# Patient Record
Sex: Female | Born: 2000 | Race: White | Hispanic: No | Smoking: Never smoker
Health system: Southern US, Community
[De-identification: ages and names within clinical notes are randomized; demographics above are authoritative.]

## PROBLEM LIST (undated history)

## (undated) ENCOUNTER — Inpatient Hospital Stay: Payer: Self-pay

## (undated) DIAGNOSIS — N390 Urinary tract infection, site not specified: Secondary | ICD-10-CM

## (undated) DIAGNOSIS — Z789 Other specified health status: Secondary | ICD-10-CM

## (undated) HISTORY — PX: NO PAST SURGERIES: SHX2092

---
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
## 2018-02-26 IMAGING — US US OB COMP +14 WK
1 series · 14 of 28 positions shown · non-contrast
Comparison: none

CLINICAL DATA: Fetal anatomy.

EXAM:
OBSTETRICAL ULTRASOUND >14 WKS

[Series 1: us ob comp +14 wk · 0.25mm/px · 14 of 78 slices shown]
[im 3/78]
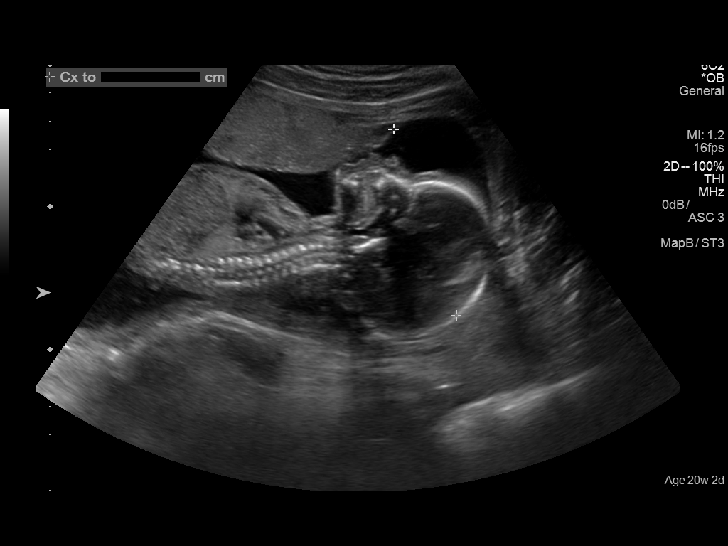
[im 9/78]
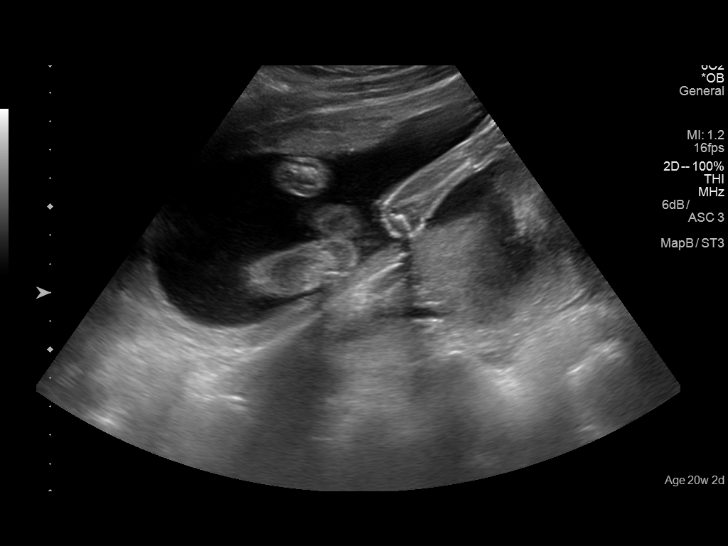
[im 15/78]
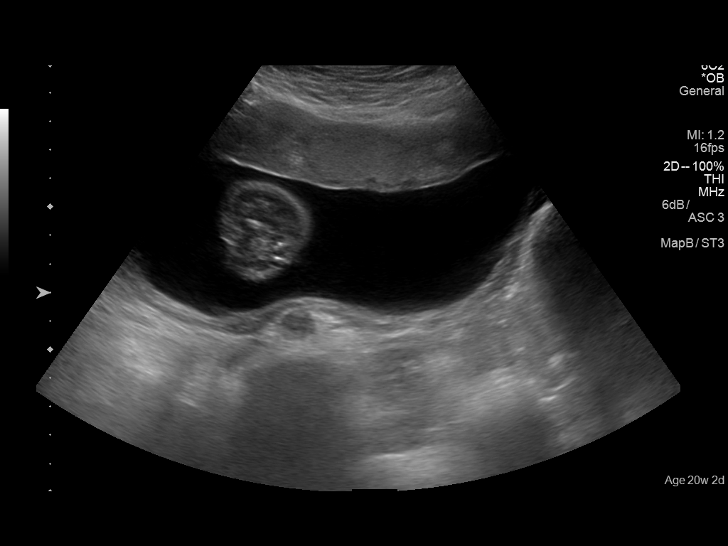
[im 20/78]
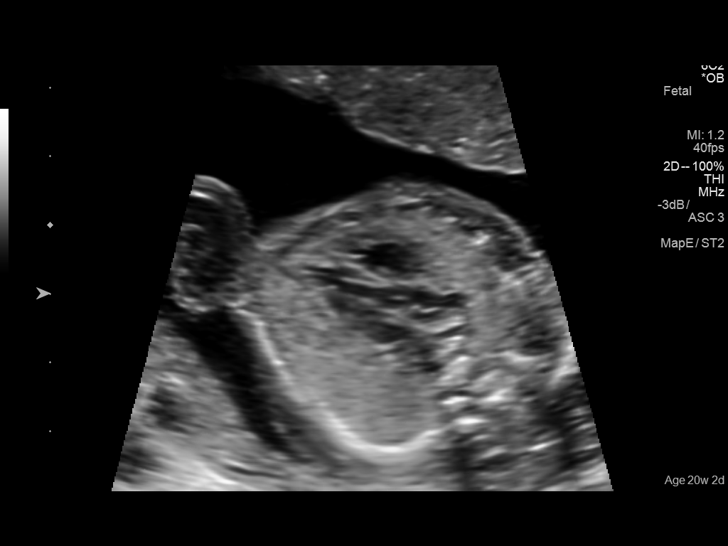
[im 26/78]
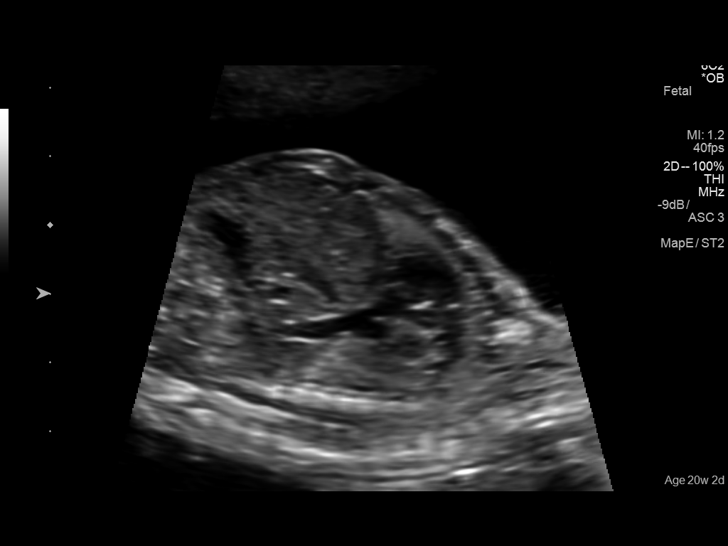
[im 32/78]
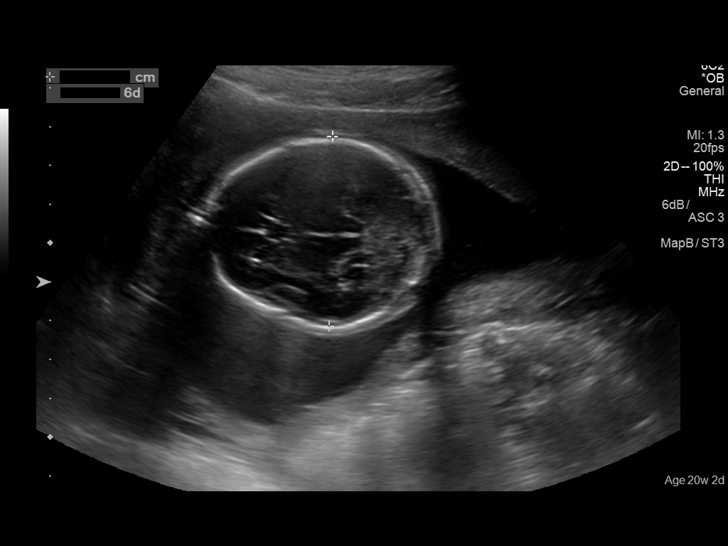
[im 38/78]
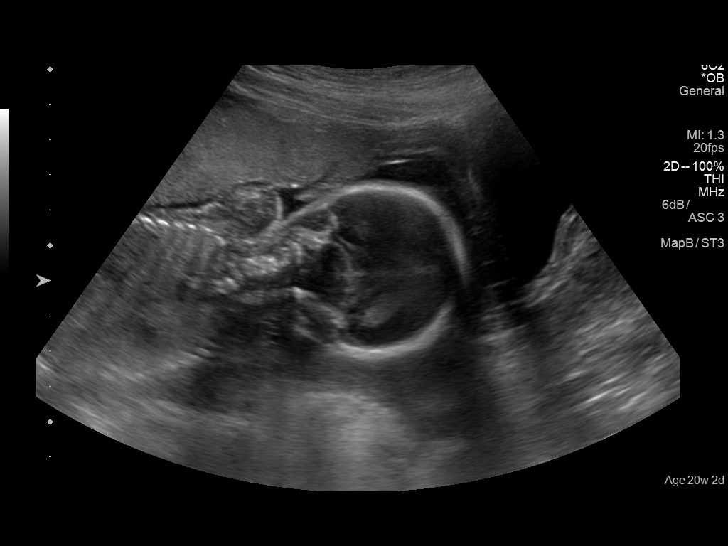
[im 43/78]
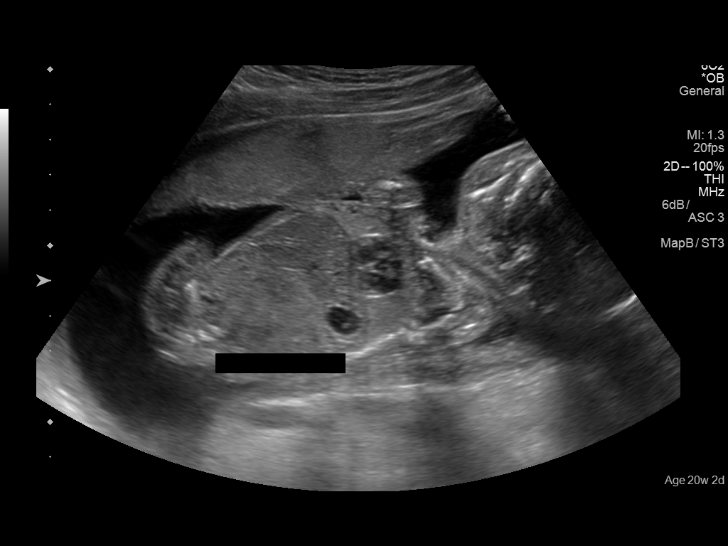
[im 49/78]
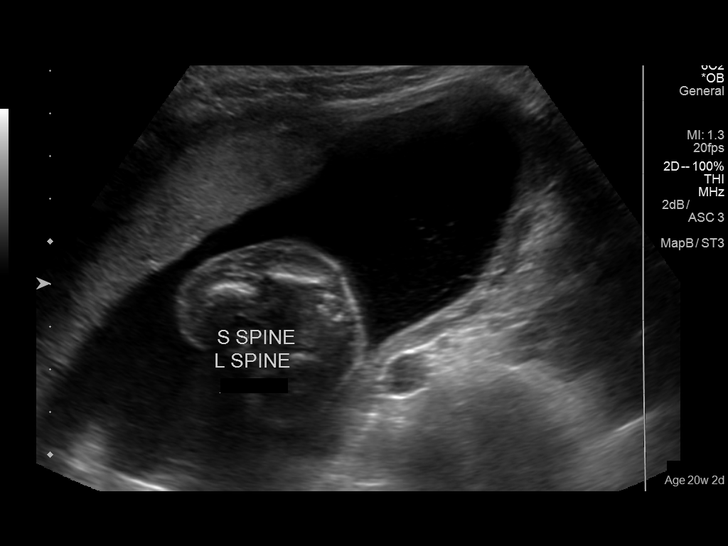
[im 55/78]
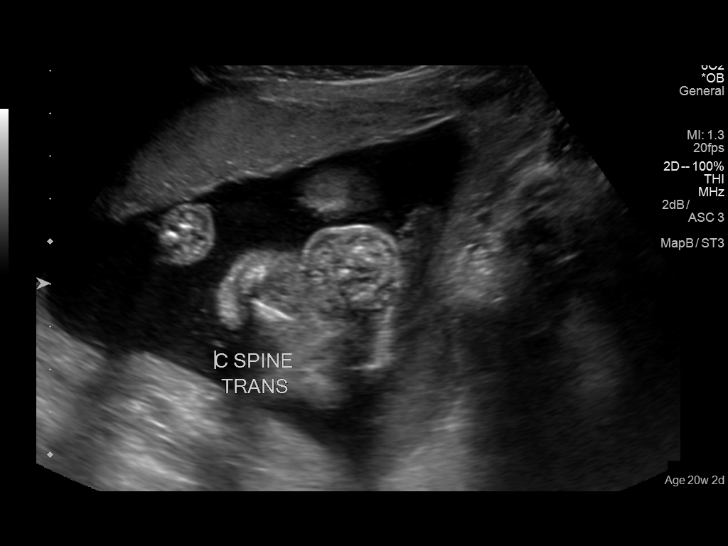
[im 60/78]
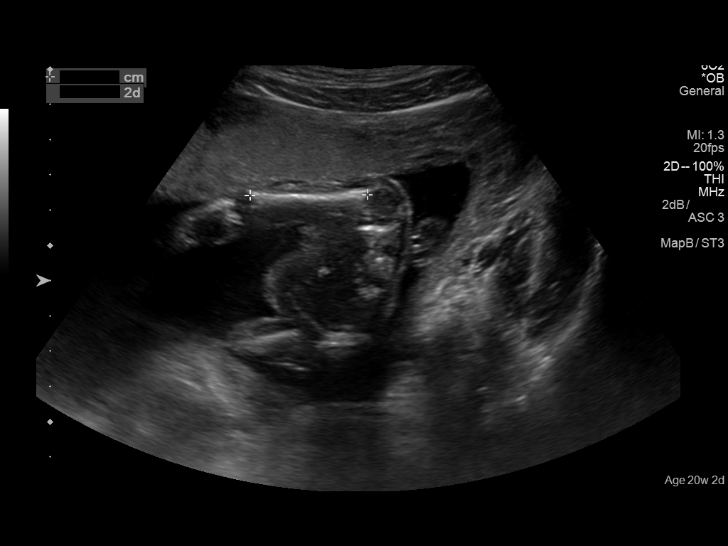
[im 66/78]
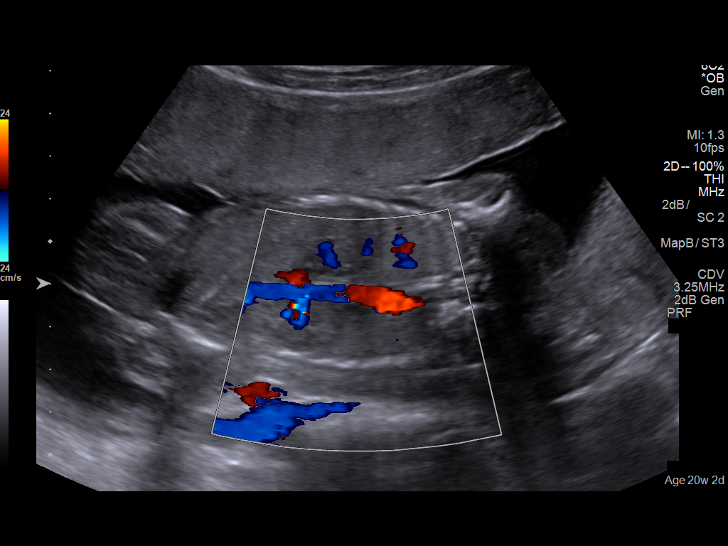
[im 72/78]
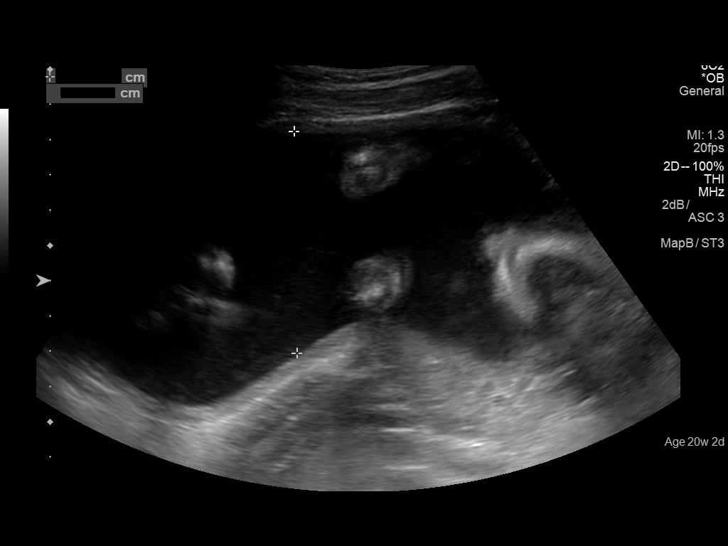
[im 78/78]
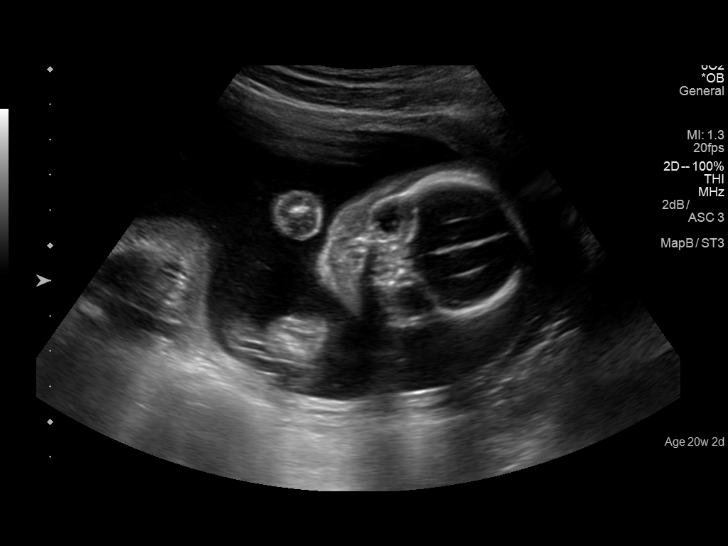

[14 of 28 positions shown; findings below may reference images not displayed]

FINDINGS: Number of Fetuses: 1

Heart Rate:  140 bpm

Movement: Yes

Presentation: Cephalic

Previa: No

Placental Location: Anterior

Amniotic Fluid (Subjective): Normal

Amniotic Fluid (Objective):

Vertical pocket 6.3cm

FETAL BIOMETRY

BPD:  4.9cm 20w 6d

HC:    17.7cm  20w   1d

AC:   15.3cm  20w   3d

FL:   3.6cm  21w   2d

Current Mean GA: 20w 6d              US EDC: 03/25/2017

Estimated Fetal Weight:  376g    73%ile

FETAL ANATOMY

Lateral Ventricles: Appears normal

Thalami/CSP: Appears normal

Posterior Fossa:  Appears normal

Nuchal Region: Appears normal

Upper Lip: Appears normal

Spine: Appears normal

4 Chamber Heart on Left: Appears normal

LVOT: Appears normal

RVOT: Appears normal

Stomach on Left: Appears normal

3 Vessel Cord: Appears normal

Cord Insertion site: Appears normal

Kidneys: Appears normal

Bladder: Appears normal

Extremities: Appears normal

Maternal Findings:

Cervix:  Normal cervical length measuring 3.7 cm.  Cervix is closed.
IMPRESSION: 1. Single live intrauterine pregnancy as detailed above.

## 2020-08-05 NOTE — L&D Delivery Note (Signed)
Delivery Note  Date of delivery: 05/20/2021 Estimated Date of Delivery: 05/13/21 No LMP recorded. EGA: [redacted]w[redacted]d  Delivery Note At 1:51 PM a viable female was delivered via Vaginal, Spontaneous (Presentation: Middle Occiput Anterior).  APGAR: 8, 9; weight 7 lb 3 oz (3260 g).   Placenta status: Spontaneous, Intact.  Cord: 3 vessels with the following complications: None.    First Stage: Labor onset: 0748 Augmentation : none -  Induction: Cytotec Analgesia /Anesthesia intrapartum: Epidural SROM at 0946  Wanda Booker presented to L&D with post date pregnancy. She was induced with cytotec. Epidural placed for pain relief.   Second Stage: Complete dilation at 1223 Onset of pushing at 1328 FHR second stage Cat I Delivery at 1351 on 05/20/2021  She progressed to complete and had a spontaneous vaginal birth of a live female over an intact perineum. The fetal head was delivered in OA position with restitution to ROA. No nuchal cord. Anterior then posterior shoulders delivered spontaneously. Baby placed on mom's abdomen and attended to by transition RN. Cord clamped and cut when pulseless by FOB.   Third Stage: Placenta delivered intact with 3VC at 1400 Placenta disposition: Routine disposal Uterine tone firm / bleeding min IV pitocin given for hemorrhage prophylaxis  Anesthesia: Epidural Episiotomy: None Lacerations: None Suture Repair: n/a Est. Blood Loss (mL): 200  Complications: None  Mom to postpartum.  Baby to Couplet care / Skin to Skin.  Newborn: Birth Weight: 7 lb 3 oz (3260 g)  Apgar Scores: 8, 9 Feeding planned: Formula   [name], CNM 05/20/2021 9:52 PM

## 2020-09-27 ENCOUNTER — Other Ambulatory Visit: Payer: Self-pay | Admitting: Primary Care

## 2020-09-27 DIAGNOSIS — Z3201 Encounter for pregnancy test, result positive: Secondary | ICD-10-CM

## 2020-10-17 ENCOUNTER — Ambulatory Visit
Admission: RE | Admit: 2020-10-17 | Discharge: 2020-10-17 | Disposition: A | Discharge status: Home / Self Care | Payer: BC Managed Care – PPO | Source: Ambulatory Visit | Attending: Primary Care | Admitting: Primary Care

## 2020-10-17 ENCOUNTER — Other Ambulatory Visit: Payer: Self-pay | Admitting: Primary Care

## 2020-10-17 ENCOUNTER — Other Ambulatory Visit: Payer: Self-pay

## 2020-10-17 DIAGNOSIS — Z3201 Encounter for pregnancy test, result positive: Secondary | ICD-10-CM | POA: Diagnosis present

## 2020-10-17 DIAGNOSIS — Z3687 Encounter for antenatal screening for uncertain dates: Secondary | ICD-10-CM | POA: Diagnosis not present

## 2020-12-07 ENCOUNTER — Other Ambulatory Visit: Payer: Self-pay | Admitting: Primary Care

## 2020-12-07 DIAGNOSIS — Z3480 Encounter for supervision of other normal pregnancy, unspecified trimester: Secondary | ICD-10-CM

## 2021-04-06 DIAGNOSIS — Z8759 Personal history of other complications of pregnancy, childbirth and the puerperium: Secondary | ICD-10-CM | POA: Insufficient documentation

## 2021-04-06 DIAGNOSIS — Z3A36 36 weeks gestation of pregnancy: Secondary | ICD-10-CM | POA: Insufficient documentation

## 2021-04-06 DIAGNOSIS — O0993 Supervision of high risk pregnancy, unspecified, third trimester: Secondary | ICD-10-CM | POA: Insufficient documentation

## 2021-04-06 DIAGNOSIS — O0933 Supervision of pregnancy with insufficient antenatal care, third trimester: Secondary | ICD-10-CM | POA: Insufficient documentation

## 2021-04-10 ENCOUNTER — Other Ambulatory Visit: Payer: Self-pay

## 2021-04-10 ENCOUNTER — Ambulatory Visit: Payer: BC Managed Care – PPO | Attending: Maternal & Fetal Medicine

## 2021-04-10 VITALS — BP 97/65 | HR 111 | Temp 98.1°F | Resp 20 | Wt 121.0 lb

## 2021-04-10 DIAGNOSIS — Z3480 Encounter for supervision of other normal pregnancy, unspecified trimester: Secondary | ICD-10-CM | POA: Diagnosis present

## 2021-04-10 DIAGNOSIS — Z3A00 Weeks of gestation of pregnancy not specified: Secondary | ICD-10-CM | POA: Insufficient documentation

## 2021-04-10 DIAGNOSIS — Z3689 Encounter for other specified antenatal screening: Secondary | ICD-10-CM | POA: Insufficient documentation

## 2021-04-10 DIAGNOSIS — Z3A35 35 weeks gestation of pregnancy: Secondary | ICD-10-CM | POA: Diagnosis not present

## 2021-04-10 DIAGNOSIS — Z8759 Personal history of other complications of pregnancy, childbirth and the puerperium: Secondary | ICD-10-CM

## 2021-04-10 DIAGNOSIS — O0993 Supervision of high risk pregnancy, unspecified, third trimester: Secondary | ICD-10-CM

## 2021-04-10 DIAGNOSIS — O0933 Supervision of pregnancy with insufficient antenatal care, third trimester: Secondary | ICD-10-CM

## 2021-04-10 DIAGNOSIS — Z3A36 36 weeks gestation of pregnancy: Secondary | ICD-10-CM

## 2021-04-11 ENCOUNTER — Other Ambulatory Visit: Payer: Self-pay | Admitting: Primary Care

## 2021-04-11 DIAGNOSIS — Z3689 Encounter for other specified antenatal screening: Secondary | ICD-10-CM

## 2021-04-11 DIAGNOSIS — Z3480 Encounter for supervision of other normal pregnancy, unspecified trimester: Secondary | ICD-10-CM

## 2021-05-20 ENCOUNTER — Inpatient Hospital Stay
Admission: EM | Admit: 2021-05-20 | Discharge: 2021-05-21 | DRG: 807 | Disposition: A | Discharge status: Home / Self Care | Payer: BC Managed Care – PPO | Attending: Obstetrics and Gynecology | Admitting: Obstetrics and Gynecology

## 2021-05-20 ENCOUNTER — Other Ambulatory Visit: Payer: Self-pay

## 2021-05-20 ENCOUNTER — Inpatient Hospital Stay: Payer: BC Managed Care – PPO | Admitting: Anesthesiology

## 2021-05-20 ENCOUNTER — Encounter: Payer: Self-pay | Admitting: Obstetrics and Gynecology

## 2021-05-20 DIAGNOSIS — Z23 Encounter for immunization: Secondary | ICD-10-CM | POA: Diagnosis not present

## 2021-05-20 DIAGNOSIS — Z3A41 41 weeks gestation of pregnancy: Secondary | ICD-10-CM | POA: Diagnosis not present

## 2021-05-20 DIAGNOSIS — O9902 Anemia complicating childbirth: Secondary | ICD-10-CM | POA: Diagnosis present

## 2021-05-20 DIAGNOSIS — Z20822 Contact with and (suspected) exposure to covid-19: Secondary | ICD-10-CM | POA: Diagnosis present

## 2021-05-20 DIAGNOSIS — O480 Post-term pregnancy: Principal | ICD-10-CM | POA: Diagnosis present

## 2021-05-20 DIAGNOSIS — D509 Iron deficiency anemia, unspecified: Secondary | ICD-10-CM | POA: Diagnosis present

## 2021-05-20 LAB — TYPE AND SCREEN
ABO/RH(D): A POS
Antibody Screen: NEGATIVE

## 2021-05-20 LAB — CBC
HCT: 33.3 % — ABNORMAL LOW (ref 36.0–46.0)
Hemoglobin: 11.4 g/dL — ABNORMAL LOW (ref 12.0–15.0)
MCH: 30.8 pg (ref 26.0–34.0)
MCHC: 34.2 g/dL (ref 30.0–36.0)
MCV: 90 fL (ref 80.0–100.0)
Platelets: 259 10*3/uL (ref 150–400)
RBC: 3.7 MIL/uL — ABNORMAL LOW (ref 3.87–5.11)
RDW: 14.3 % (ref 11.5–15.5)
WBC: 11.3 10*3/uL — ABNORMAL HIGH (ref 4.0–10.5)
nRBC: 0 % (ref 0.0–0.2)

## 2021-05-20 LAB — URINE DRUG SCREEN, QUALITATIVE (ARMC ONLY)
Amphetamines, Ur Screen: NOT DETECTED
Barbiturates, Ur Screen: NOT DETECTED
Benzodiazepine, Ur Scrn: NOT DETECTED
Cannabinoid 50 Ng, Ur ~~LOC~~: NOT DETECTED
Cocaine Metabolite,Ur ~~LOC~~: NOT DETECTED
MDMA (Ecstasy)Ur Screen: NOT DETECTED
Methadone Scn, Ur: NOT DETECTED
Opiate, Ur Screen: NOT DETECTED
Phencyclidine (PCP) Ur S: NOT DETECTED
Tricyclic, Ur Screen: NOT DETECTED

## 2021-05-20 LAB — RESP PANEL BY RT-PCR (FLU A&B, COVID) ARPGX2
Influenza A by PCR: NEGATIVE
Influenza B by PCR: NEGATIVE
SARS Coronavirus 2 by RT PCR: NEGATIVE

## 2021-05-20 MED ORDER — PHENYLEPHRINE 40 MCG/ML (10ML) SYRINGE FOR IV PUSH (FOR BLOOD PRESSURE SUPPORT): 80.0000 ug | PREFILLED_SYRINGE | INTRAVENOUS | Status: DC | PRN

## 2021-05-20 MED ORDER — SOD CITRATE-CITRIC ACID 500-334 MG/5ML PO SOLN: 30.0000 mL | ORAL | Status: DC | PRN

## 2021-05-20 MED ORDER — SIMETHICONE 80 MG PO CHEW: 80.0000 mg | CHEWABLE_TABLET | ORAL | Status: DC | PRN

## 2021-05-20 MED ORDER — LACTATED RINGERS IV SOLN: 500.0000 mL | INTRAVENOUS | Status: DC | PRN

## 2021-05-20 MED ORDER — MISOPROSTOL 200 MCG PO TABS
ORAL_TABLET | ORAL | Status: AC
  Filled 2021-05-20: qty 4

## 2021-05-20 MED ORDER — OXYCODONE HCL 5 MG PO TABS: 5.0000 mg | ORAL_TABLET | ORAL | Status: DC | PRN

## 2021-05-20 MED ORDER — TETANUS-DIPHTH-ACELL PERTUSSIS 5-2.5-18.5 LF-MCG/0.5 IM SUSY: 0.5000 mL | PREFILLED_SYRINGE | Freq: Once | INTRAMUSCULAR | Status: DC

## 2021-05-20 MED ORDER — DOCUSATE SODIUM 100 MG PO CAPS
100.0000 mg | ORAL_CAPSULE | Freq: Two times a day (BID) | ORAL | Status: DC
  Administered 2021-05-21: 100 mg via ORAL
  Filled 2021-05-20: qty 1

## 2021-05-20 MED ORDER — LIDOCAINE-EPINEPHRINE (PF) 1.5 %-1:200000 IJ SOLN
INTRAMUSCULAR | Status: DC | PRN
  Administered 2021-05-20: 3 mL via EPIDURAL

## 2021-05-20 MED ORDER — AMMONIA AROMATIC IN INHA
RESPIRATORY_TRACT | Status: AC
  Filled 2021-05-20: qty 10

## 2021-05-20 MED ORDER — ONDANSETRON HCL 4 MG/2ML IJ SOLN: 4.0000 mg | INTRAMUSCULAR | Status: DC | PRN

## 2021-05-20 MED ORDER — OXYCODONE HCL 5 MG PO TABS: 10.0000 mg | ORAL_TABLET | ORAL | Status: DC | PRN

## 2021-05-20 MED ORDER — DIBUCAINE (PERIANAL) 1 % EX OINT: 1.0000 "application " | TOPICAL_OINTMENT | CUTANEOUS | Status: DC | PRN

## 2021-05-20 MED ORDER — SODIUM CHLORIDE 0.9 % IV SOLN
INTRAVENOUS | Status: DC | PRN
  Administered 2021-05-20 (×2): 5 mL via EPIDURAL

## 2021-05-20 MED ORDER — OXYTOCIN-SODIUM CHLORIDE 30-0.9 UT/500ML-% IV SOLN
INTRAVENOUS | Status: AC
  Administered 2021-05-20: 333 mL via INTRAVENOUS
  Filled 2021-05-20: qty 500

## 2021-05-20 MED ORDER — ACETAMINOPHEN 325 MG PO TABS
650.0000 mg | ORAL_TABLET | ORAL | Status: DC | PRN
  Administered 2021-05-20 (×2): 650 mg via ORAL
  Filled 2021-05-20 (×2): qty 2

## 2021-05-20 MED ORDER — EPHEDRINE 5 MG/ML INJ: 10.0000 mg | INTRAVENOUS | Status: DC | PRN

## 2021-05-20 MED ORDER — DIPHENHYDRAMINE HCL 50 MG/ML IJ SOLN
12.5000 mg | INTRAMUSCULAR | Status: DC | PRN
Start: 2021-05-20 — End: 2021-05-20

## 2021-05-20 MED ORDER — IBUPROFEN 600 MG PO TABS
600.0000 mg | ORAL_TABLET | Freq: Four times a day (QID) | ORAL | Status: DC
  Administered 2021-05-20 – 2021-05-21 (×4): 600 mg via ORAL
  Filled 2021-05-20 (×4): qty 1

## 2021-05-20 MED ORDER — LIDOCAINE HCL (PF) 1 % IJ SOLN
30.0000 mL | INTRAMUSCULAR | Status: DC | PRN
Start: 2021-05-20 — End: 2021-05-20

## 2021-05-20 MED ORDER — ONDANSETRON HCL 4 MG/2ML IJ SOLN: 4.0000 mg | Freq: Four times a day (QID) | INTRAMUSCULAR | Status: DC | PRN

## 2021-05-20 MED ORDER — FENTANYL-BUPIVACAINE-NACL 0.5-0.125-0.9 MG/250ML-% EP SOLN
12.0000 mL/h | EPIDURAL | Status: DC | PRN
Start: 2021-05-20 — End: 2021-05-20
  Administered 2021-05-20: 12 mL/h via EPIDURAL

## 2021-05-20 MED ORDER — DIPHENHYDRAMINE HCL 25 MG PO CAPS: 25.0000 mg | ORAL_CAPSULE | Freq: Four times a day (QID) | ORAL | Status: DC | PRN

## 2021-05-20 MED ORDER — FENTANYL-BUPIVACAINE-NACL 0.5-0.125-0.9 MG/250ML-% EP SOLN
EPIDURAL | Status: AC
  Filled 2021-05-20: qty 250

## 2021-05-20 MED ORDER — BENZOCAINE-MENTHOL 20-0.5 % EX AERO: 1.0000 "application " | INHALATION_SPRAY | CUTANEOUS | Status: DC | PRN

## 2021-05-20 MED ORDER — LACTATED RINGERS IV SOLN
500.0000 mL | Freq: Once | INTRAVENOUS | Status: AC
  Administered 2021-05-20: 500 mL via INTRAVENOUS

## 2021-05-20 MED ORDER — COCONUT OIL OIL
1.0000 | TOPICAL_OIL | Status: DC | PRN
Start: 2021-05-20 — End: 2021-05-21

## 2021-05-20 MED ORDER — LIDOCAINE HCL (PF) 1 % IJ SOLN
INTRAMUSCULAR | Status: AC
  Filled 2021-05-20: qty 30

## 2021-05-20 MED ORDER — IBUPROFEN 600 MG PO TABS
ORAL_TABLET | ORAL | Status: AC
  Administered 2021-05-20: 600 mg via ORAL
  Filled 2021-05-20: qty 1

## 2021-05-20 MED ORDER — OXYCODONE-ACETAMINOPHEN 5-325 MG PO TABS: 2.0000 | ORAL_TABLET | ORAL | Status: DC | PRN

## 2021-05-20 MED ORDER — TERBUTALINE SULFATE 1 MG/ML IJ SOLN
0.2500 mg | Freq: Once | INTRAMUSCULAR | Status: DC | PRN
Start: 2021-05-20 — End: 2021-05-20

## 2021-05-20 MED ORDER — OXYCODONE-ACETAMINOPHEN 5-325 MG PO TABS: 1.0000 | ORAL_TABLET | ORAL | Status: DC | PRN

## 2021-05-20 MED ORDER — OXYTOCIN-SODIUM CHLORIDE 30-0.9 UT/500ML-% IV SOLN: 1.0000 m[IU]/min | INTRAVENOUS | Status: DC

## 2021-05-20 MED ORDER — OXYTOCIN 10 UNIT/ML IJ SOLN
INTRAMUSCULAR | Status: AC
  Filled 2021-05-20: qty 2

## 2021-05-20 MED ORDER — FERROUS SULFATE 325 (65 FE) MG PO TABS
325.0000 mg | ORAL_TABLET | Freq: Two times a day (BID) | ORAL | Status: DC
  Administered 2021-05-20 – 2021-05-21 (×2): 325 mg via ORAL
  Filled 2021-05-20 (×2): qty 1

## 2021-05-20 MED ORDER — OXYTOCIN-SODIUM CHLORIDE 30-0.9 UT/500ML-% IV SOLN
2.5000 [IU]/h | INTRAVENOUS | Status: DC
  Administered 2021-05-20: 2.5 [IU]/h via INTRAVENOUS

## 2021-05-20 MED ORDER — MISOPROSTOL 25 MCG QUARTER TABLET
25.0000 ug | ORAL_TABLET | ORAL | Status: DC | PRN
  Administered 2021-05-20: 25 ug via VAGINAL
  Filled 2021-05-20: qty 1

## 2021-05-20 MED ORDER — PRENATAL MULTIVITAMIN CH
1.0000 | ORAL_TABLET | Freq: Every day | ORAL | Status: DC
  Administered 2021-05-21: 1 via ORAL
  Filled 2021-05-20: qty 1

## 2021-05-20 MED ORDER — LIDOCAINE HCL (PF) 1 % IJ SOLN
INTRAMUSCULAR | Status: DC | PRN
  Administered 2021-05-20: 2 mL via SUBCUTANEOUS

## 2021-05-20 MED ORDER — LACTATED RINGERS IV SOLN: INTRAVENOUS | Status: DC

## 2021-05-20 MED ORDER — ACETAMINOPHEN 325 MG PO TABS: 650.0000 mg | ORAL_TABLET | ORAL | Status: DC | PRN

## 2021-05-20 MED ORDER — ONDANSETRON HCL 4 MG PO TABS
4.0000 mg | ORAL_TABLET | ORAL | Status: DC | PRN
  Filled 2021-05-20: qty 1

## 2021-05-20 MED ORDER — OXYTOCIN BOLUS FROM INFUSION
333.0000 mL | Freq: Once | INTRAVENOUS | Status: AC
Start: 2021-05-20 — End: 2021-05-20

## 2021-05-20 MED ORDER — WITCH HAZEL-GLYCERIN EX PADS: 1.0000 "application " | MEDICATED_PAD | CUTANEOUS | Status: DC | PRN

## 2021-05-20 NOTE — Progress Notes (Signed)
Labor Progress Note  Wanda Booker is a 20 y.o. [id number] at [redacted]w[redacted]d by ultrasound admitted for induction of labor due to Post dates. Due date 05/13/21.  Subjective: Pt is comfortable with epidural.  Objective: BP 106/62 (BP Location: Left Arm)   Pulse 87   Temp 98.3 F (36.8 C) (Oral)   Resp 16   Ht 5\' 2"  (1.575 m)   Wt 57.2 kg   SpO2 98%   Breastfeeding Unknown   BMI 23.05 kg/m    Fetal Assessment: FHT:  FHR: 125 bpm, variability: moderate,  accelerations:  Present,  decelerations:  Absent Category/reactivity:  Category I UC:   regular, every 1-3 minutes SVE:    Dilation: 10cm  Effacement: 100%  Station:  +1  Consistency: firm  Position: anterior  Membrane status:SROM at 0948 Amniotic color: MSAF  Labs: Lab Results  Component Value Date   WBC 11.3 (H) 05/20/2021   HGB 11.4 (L) 05/20/2021   HCT 33.3 (L) 05/20/2021   MCV 90.0 05/20/2021   PLT 259 05/20/2021    Assessment / Plan: Induction of labor due to postterm,  progressing on one dose of cytotec 0748 Cytotec 25 mcg placed PV 0948 SROM  Labor: Progressing normally Preeclampsia:   118/70 Fetal Wellbeing:  Category I Pain Control:  Epidural I/D:  n/a Anticipated MOD:  NSVD  05/22/2021, CNM 05/20/2021, 9:44 PM

## 2021-05-20 NOTE — Anesthesia Procedure Notes (Signed)
Epidural Patient location during procedure: OB Start time: 05/20/2021 10:32 AM End time: 05/20/2021 10:36 AM  Staffing Anesthesiologist: Lenard Simmer, MD Performed: anesthesiologist   Preanesthetic Checklist Completed: patient identified, IV checked, site marked, risks and benefits discussed, surgical consent, monitors and equipment checked, pre-op evaluation and timeout performed  Epidural Patient position: sitting Prep: ChloraPrep Patient monitoring: heart rate, continuous pulse ox and blood pressure Approach: midline Location: L3-L4 Injection technique: LOR saline  Needle:  Needle type: Tuohy  Needle gauge: 17 G Needle length: 9 cm and 9 Needle insertion depth: 3 cm Catheter type: closed end flexible Catheter size: 19 Gauge Catheter at skin depth: 8 cm Test dose: negative and 1.5% lidocaine with Epi 1:200 K  Assessment Sensory level: T10 Events: blood not aspirated, injection not painful, no injection resistance, no paresthesia and negative IV test  Additional Notes 1st attempt Pt. Evaluated and documentation done after procedure finished. Patient identified. Risks/Benefits/Options discussed with patient including but not limited to bleeding, infection, nerve damage, paralysis, failed block, incomplete pain control, headache, blood pressure changes, nausea, vomiting, reactions to medication both or allergic, itching and postpartum back pain. Confirmed with bedside nurse the patient's most recent platelet count. Confirmed with patient that they are not currently taking any anticoagulation, have any bleeding history or any family history of bleeding disorders. Patient expressed understanding and wished to proceed. All questions were answered. Sterile technique was used throughout the entire procedure. Please see nursing notes for vital signs. Test dose was given through epidural catheter and negative prior to continuing to dose epidural or start infusion. Warning signs of high  block given to the patient including shortness of breath, tingling/numbness in hands, complete motor block, or any concerning symptoms with instructions to call for help. Patient was given instructions on fall risk and not to get out of bed. All questions and concerns addressed with instructions to call with any issues or inadequate analgesia.    Patient tolerated the insertion well without immediate complications.Reason for block:procedure for pain

## 2021-05-20 NOTE — Anesthesia Preprocedure Evaluation (Signed)
Anesthesia Evaluation  Patient identified by MRN, date of birth, ID band Patient awake    Reviewed: Allergy & Precautions, H&P , NPO status , Patient's Chart, lab work & pertinent test results  History of Anesthesia Complications Negative for: history of anesthetic complications  Airway Mallampati: II  TM Distance: >3 FB Neck ROM: full    Dental  (+) Chipped   Pulmonary neg pulmonary ROS,    Pulmonary exam normal        Cardiovascular negative cardio ROS Normal cardiovascular exam     Neuro/Psych negative neurological ROS  negative psych ROS   GI/Hepatic Neg liver ROS, GERD  ,  Endo/Other  negative endocrine ROS  Renal/GU negative Renal ROS  negative genitourinary   Musculoskeletal   Abdominal   Peds  Hematology negative hematology ROS (+)   Anesthesia Other Findings Past Medical History: No date: Medical history non-contributory No date: UTI (urinary tract infection)   Reproductive/Obstetrics (+) Pregnancy                             Anesthesia Physical  Anesthesia Plan  ASA: II  Anesthesia Plan: Epidural   Post-op Pain Management:    Induction:   PONV Risk Score and Plan:   Airway Management Planned:   Additional Equipment:   Intra-op Plan:   Post-operative Plan:   Informed Consent: I have reviewed the patients History and Physical, chart, labs and discussed the procedure including the risks, benefits and alternatives for the proposed anesthesia with the patient or authorized representative who has indicated his/her understanding and acceptance.       Plan Discussed with: Anesthesiologist and CRNA  Anesthesia Plan Comments:         Anesthesia Quick Evaluation

## 2021-05-20 NOTE — H&P (Signed)
OB History & Physical   History of Present Illness:  Chief Complaint:   HPI:  RUTHANNE MCNEISH is a 20 y.o. G21P1001 female at [redacted]w[redacted]d dated by u/s.  She presents to L&D for IOL for post dates.  She reports:  -active fetal movement -no leakage of fluid  -no vaginal bleeding -no contractions  Pregnancy Issues: 1. Anemia 2. Varicella non-immune    Maternal Medical History:   Past Medical History:  Diagnosis Date   Medical history non-contributory    UTI (urinary tract infection)     Past Surgical History:  Procedure Laterality Date   NO PAST SURGERIES      No Known Allergies  Prior to Admission medications   Medication Sig Start Date End Date Taking? Authorizing Provider  dicyclomine (BENTYL) 20 MG tablet Take 1 tablet (20 mg total) by mouth 3 (three) times daily as needed (abdominal pain). Patient not taking: Reported on 04/10/2021 12/12/17   Phineas Semen, MD  ferrous sulfate 325 (65 FE) MG tablet Take 325 mg by mouth daily with breakfast.    [provider]  ibuprofen (ADVIL,MOTRIN) 600 MG tablet Take 1 tablet (600 mg total) by mouth every 6 (six) hours. Patient not taking: Reported on 04/10/2021 03/27/17   Christeen Douglas, MD  medroxyPROGESTERone (DEPO-PROVERA) 150 MG/ML injection Inject 1 mL (150 mg total) into the muscle every 3 (three) months. Patient not taking: Reported on 04/10/2021 03/27/17   Christeen Douglas, MD  ondansetron (ZOFRAN) 4 MG tablet Take 1 tablet (4 mg total) by mouth every 4 (four) hours as needed for nausea. Patient not taking: Reported on 04/10/2021 03/27/17   Christeen Douglas, MD  ondansetron [facility]) 4 MG tablet Take 1 tablet (4 mg total) by mouth every 8 (eight) hours as needed for nausea or vomiting. 12/12/17   Phineas Semen, MD  Prenatal Vit-Fe Fumarate-FA (PRENATAL MULTIVITAMIN) TABS tablet Take 1 tablet by mouth daily at 12 noon.    [provider]     [facility]ren[facility] care site: [facility]  Social History: She  reports that she  has never smoked. She has never used smokeless tobacco. She reports that she does not drink alcohol and does not use drugs.  Family History: family history is not on file.   Review of Systems: A full review of systems was performed and negative except as noted in the HPI.    Physical Exam:  Vital Signs: BP 121/84 (BP Location: Left Arm)   Pulse 97   Temp 98.4 F (36.9 C) (Oral)   Resp 16   Ht 5\' 2"  (1.575 m)   Wt 57.2 kg   BMI 23.05 kg/m   General:   alert and cooperative  Skin:  normal  Neurologic:    Alert & oriented x 3  Lungs:    Nl effort  Heart:   regular rate and rhythm  Abdomen:  soft, non-tender; bowel sounds normal; no masses,  no organomegaly  Extremities: : non-tender, symmetric, no edema bilaterally.       Pertinent Results:  Prenatal Labs: Blood type/Rh A pos  Antibody screen neg  Rubella Immune  Varicella Non-Immune  RPR NR  HBsAg Neg  HIV NR  GC neg  Chlamydia neg  Genetic screening   1 hour GTT 150  3 hour GTT 76 - 137 - 111 - 121  GBS neg   FHT: FHR: 135 bpm, variability: moderate,  accelerations:  Present,  decelerations:  Absent Category/reactivity:  Category I TOCO: none SVE: Dilation: 2.5 / Effacement (%):  30 / Station: -2     Assessment:  XIN KLAWITTER is a 20 y.o. G2P1001 female at [redacted]w[redacted]d with post date pregnancy.   Plan:  1. Admit to Labor & Delivery; consents reviewed and obtained  2. Fetal Well being  - Fetal Tracing: Cat I - GBS neg - Presentation: vtx confirmed by SVE   3. Routine OB: - Prenatal labs reviewed, as above - Rh pos - CBC & T&S on admit - Clear fluids, IV  4. Induction of Labor -  Contractions by external toco in place -  Pelvis proven to 5lb9oz -  Plan for induction with Cytotec -  Plan for continuous fetal monitoring  -  Maternal pain control as desired: IVPM, nitrous, regional anesthesia - Anticipate vaginal delivery  5. Post Partum Planning: - Infant feeding: Formula - Contraception: Depo - TDAP  04/11/21  - Flu needed  Kaarin Pardy, CNM 05/20/2021 7:16 AM

## 2021-05-20 NOTE — Discharge Summary (Signed)
Obstetrical Discharge Summary  Patient Name: Wanda Booker DOB: 2001-03-02 MRN: [id number]  Date of Admission: 05/20/2021 Date of Delivery: 05/20/21 Delivered by: Anselm Pancoast Date of Discharge: 05/21/2021  [facility]: [facility] LMP:No LMP recorded. EDC Estimated Date of Delivery: 05/13/21 Gestational Age at Delivery: [redacted]w[redacted]d   Antepartum complications:  1. Anemia 2. Varicella non-immune Admitting Diagnosis: IOL for post date pregnancy Secondary Diagnosis: Patient Active Problem List   Diagnosis Date Noted   NSVD (normal spontaneous vaginal delivery) 05/20/2021   History of prior pregnancy with small for gestational age newborn 04/06/2021    Augmentation: Cytotec Complications: None  Intrapartum complications/course:  Delivery Type: spontaneous vaginal delivery Anesthesia: epidural Placenta: spontaneous Laceration: none Episiotomy: none Newborn Data: Live born female  Birth Weight: 7 lb 3 oz (3260 g) APGAR: 8, 9  Newborn Delivery   Birth date/time: 05/20/2021 13:51:00 Delivery type: Vaginal, Spontaneous      20 y.o. [id number] at [redacted]w[redacted]d presenting with post date pregnancy, SROM with MSAF.  She progressed to complete and pushed over an intact perineum and delivered the fetal head, followed promptly by the shoulders. She was in control the whole time, and the baby placed on the maternal abdomen. Delayed cord clamping and the FOB cut the baby's cord, while the baby was skin to skin. The placenta delivered spontaneously and intact. No lacerations note. Mom and baby tolerated the procedure well.   Postpartum Procedures: none  Post partum course:  Patient had an uncomplicated postpartum course.  By time of discharge on PPD#1, her pain was controlled on oral pain medications; she had appropriate lochia and was ambulating, voiding without difficulty and tolerating regular diet.  She was deemed stable for discharge to home.    Discharge Physical Exam:  BP 113/69 (BP Location:  Left Arm)   Pulse 67   Temp 98 F (36.7 C) (Oral)   Resp 18   Ht 5\' 2"  (1.575 m)   Wt 57.2 kg   SpO2 98%   Breastfeeding Unknown   BMI 23.05 kg/m   General: alert and no distress Pulm: normal respiratory effort Lochia: appropriate Abdomen: soft, NT Uterine Fundus: firm, below umbilicus Extremities: No evidence of DVT seen on physical exam. No lower extremity edema. Edinburgh Postnatal Depression Scale Screening Tool 05/21/2021  I have been able to laugh and see the funny side of things. 0  I have looked forward with enjoyment to things. 0  I have blamed myself unnecessarily when things went wrong. 0  I have been anxious or worried for no good reason. 0  I have felt scared or panicky for no good reason. 0  Things have been getting on top of me. 1  I have been so unhappy that I have had difficulty sleeping. 0  I have felt sad or miserable. 0  I have been so unhappy that I have been crying. 0  The thought of harming myself has occurred to me. 0  Edinburgh Postnatal Depression Scale Total 1     Labs: CBC Latest Ref Rng & Units 05/21/2021 05/20/2021 12/12/2017  WBC 4.0 - 10.5 K/uL 11.4(H) 11.3(H) 4.6  Hemoglobin 12.0 - 15.0 g/dL 02/11/2018) 11.4(L) 14.5  Hematocrit 36.0 - 46.0 % 27.8(L) 33.3(L) 42.5  Platelets 150 - 400 K/uL 208 259 264   A POS Hemoglobin  Date Value Ref Range Status  05/21/2021 9.5 (L) 12.0 - 15.0 g/dL Final   HCT  Date Value Ref Range Status  05/21/2021 27.8 (L) 36.0 - 46.0 % Final  Disposition: stable, discharge to home Baby Feeding: formula Baby Disposition: home with mom  Contraception: Depo  Prenatal Labs:  Blood type/Rh A pos  Antibody screen neg  Rubella Immune  Varicella Non-Immune  RPR NR  HBsAg Neg  HIV NR  GC neg  Chlamydia neg  Genetic screening    1 hour GTT 150  3 hour GTT 76 - 137 - 111 - 121  GBS neg   Rh Immune globulin given: n/a Rubella vaccine given: Immune Varicella vaccine given: Needed PP Tdap  vaccine given in AP or PP setting: given 04/11/21 Flu vaccine given in AP or PP setting: Needed PP  Plan: Cloretta Ned was discharged to home in good condition. Follow-up appointment with delivering provider in 6 weeks.  Discharge Instructions: Per After Visit Summary. Activity: Advance as tolerated. Pelvic rest for 6 weeks.   Diet: Regular Discharge Medications: Allergies as of 05/21/2021   No Known Allergies      Medication List     STOP taking these medications    medroxyPROGESTERone 150 MG/ML injection Commonly known as: DEPO-PROVERA   ondansetron 4 MG tablet Commonly known as: Zofran       TAKE these medications    acetaminophen 325 MG tablet Commonly known as: Tylenol Take 2 tablets (650 mg total) by mouth every 4 (four) hours as needed (for pain scale < 4).   benzocaine-Menthol 20-0.5 % Aero Commonly known as: DERMOPLAST Apply 1 application topically as needed for irritation (perineal discomfort).   coconut oil Oil Apply 1 application topically as needed.   dibucaine 1 % Oint Commonly known as: NUPERCAINAL Place 1 application rectally as needed for hemorrhoids.   dicyclomine 20 MG tablet Commonly known as: Bentyl Take 1 tablet (20 mg total) by mouth 3 (three) times daily as needed (abdominal pain).   docusate sodium 100 MG capsule Commonly known as: COLACE Take 1 capsule (100 mg total) by mouth 2 (two) times daily.   ferrous sulfate 325 (65 FE) MG tablet Take 1 tablet (325 mg total) by mouth 2 (two) times daily with a meal. What changed: when to take this   ibuprofen 600 MG tablet Commonly known as: ADVIL Take 1 tablet (600 mg total) by mouth every 6 (six) hours.   prenatal multivitamin Tabs tablet Take 1 tablet by mouth daily at 12 noon.   witch hazel-glycerin pad Commonly known as: TUCKS Apply 1 application topically as needed for hemorrhoids.       Outpatient follow up:   Follow-up Information     [facility] Follow up in 6 week(s).   Specialty: General Practice Contact information: [address] [place] [address] [phone]         Haroldine Laws, CNM Follow up.   Specialty: Certified Nurse Midwife Contact information: [address] [place] [address] [phone]                 Signed: Chari Manning [facility]  05/21/2021 11:46 AM

## 2021-05-21 LAB — CBC
HCT: 27.8 % — ABNORMAL LOW (ref 36.0–46.0)
Hemoglobin: 9.5 g/dL — ABNORMAL LOW (ref 12.0–15.0)
MCH: 31.4 pg (ref 26.0–34.0)
MCHC: 34.2 g/dL (ref 30.0–36.0)
MCV: 91.7 fL (ref 80.0–100.0)
Platelets: 208 10*3/uL (ref 150–400)
RBC: 3.03 MIL/uL — ABNORMAL LOW (ref 3.87–5.11)
RDW: 14.2 % (ref 11.5–15.5)
WBC: 11.4 10*3/uL — ABNORMAL HIGH (ref 4.0–10.5)
nRBC: 0 % (ref 0.0–0.2)

## 2021-05-21 LAB — RPR: RPR Ser Ql: NONREACTIVE

## 2021-05-21 MED ORDER — BENZOCAINE-MENTHOL 20-0.5 % EX AERO: 1.0000 "application " | INHALATION_SPRAY | CUTANEOUS | Status: AC | PRN

## 2021-05-21 MED ORDER — MEDROXYPROGESTERONE ACETATE 150 MG/ML IM SUSP
150.0000 mg | Freq: Once | INTRAMUSCULAR | Status: AC
  Administered 2021-05-21: 150 mg via INTRAMUSCULAR
  Filled 2021-05-21: qty 1

## 2021-05-21 MED ORDER — VARICELLA VIRUS VACCINE LIVE 1350 PFU/0.5ML IJ SUSR
0.5000 mL | Freq: Once | INTRAMUSCULAR | Status: AC
  Administered 2021-05-21: 0.5 mL via SUBCUTANEOUS
  Filled 2021-05-21 (×3): qty 0.5

## 2021-05-21 MED ORDER — WITCH HAZEL-GLYCERIN EX PADS: 1.0000 "application " | MEDICATED_PAD | CUTANEOUS | 12 refills | Status: AC | PRN

## 2021-05-21 MED ORDER — INFLUENZA VAC SPLIT QUAD 0.5 ML IM SUSY
0.5000 mL | PREFILLED_SYRINGE | INTRAMUSCULAR | Status: DC | PRN
  Filled 2021-05-21: qty 0.5

## 2021-05-21 MED ORDER — DOCUSATE SODIUM 100 MG PO CAPS: 100.0000 mg | ORAL_CAPSULE | Freq: Two times a day (BID) | ORAL | 0 refills | Status: AC

## 2021-05-21 MED ORDER — IBUPROFEN 600 MG PO TABS: 600.0000 mg | ORAL_TABLET | Freq: Four times a day (QID) | ORAL | 0 refills | Status: AC

## 2021-05-21 MED ORDER — DIBUCAINE (PERIANAL) 1 % EX OINT: 1.0000 "application " | TOPICAL_OINTMENT | CUTANEOUS | Status: AC | PRN

## 2021-05-21 MED ORDER — COCONUT OIL OIL: 1.0000 "application " | TOPICAL_OIL | 0 refills | Status: AC | PRN

## 2021-05-21 MED ORDER — FERROUS SULFATE 325 (65 FE) MG PO TABS: 325.0000 mg | ORAL_TABLET | Freq: Two times a day (BID) | ORAL | 3 refills | Status: AC

## 2021-05-21 MED ORDER — ACETAMINOPHEN 325 MG PO TABS: 650.0000 mg | ORAL_TABLET | ORAL | Status: AC | PRN

## 2021-05-21 NOTE — Anesthesia Postprocedure Evaluation (Signed)
Anesthesia Post Note  Patient: Wanda Booker  Procedure(s) Performed: AN AD HOC LABOR EPIDURAL  Patient location during evaluation: Mother Baby Anesthesia Type: Epidural Level of consciousness: awake and alert Pain management: pain level controlled Vital Signs Assessment: post-procedure vital signs reviewed and stable Respiratory status: spontaneous breathing, nonlabored ventilation and respiratory function stable Cardiovascular status: stable Postop Assessment: no headache, no backache, epidural receding and able to ambulate Anesthetic complications: no   No notable events documented.   Last Vitals:  Vitals:   05/20/21 2320 05/21/21 0317  BP: 108/79 122/75  Pulse: 80 79  Resp: 18 18  Temp:  36.5 C  SpO2: 98% 100%    Last Pain:  Vitals:   05/21/21 0317  TempSrc: Oral  PainSc:                  [name]

## 2021-05-21 NOTE — Progress Notes (Signed)
Patient discharged home with family.  Discharge instructions, when to follow up, and prescriptions reviewed with patient.  Patient verbalized understanding. Patient will be escorted out by auxiliary.   

## 2021-05-21 NOTE — Lactation Note (Signed)
This note was copied from a baby's chart. Lactation Consultation Note  Patient Name: Wanda Booker [id number] Date: 05/21/2021 Reason for consult: Initial assessment;Mother's request Age:22 hours Lactation Rounds: LC to the room for a visit. Mother and baby are resting. Baby was cueing in bassinet with pacifier.  Mother states her plan is to feed breast and formula. LC reviewed and encouraged feeding on demand and with cues. If baby is not cueing we encourage hand expression and to wake baby. Encouraged breastfeed prior to bottle. Reviewed diaper counts for days of life and when to call Peds with questions. Reviewed "understanding Postpartum and Newborn care " booklet at bedside. Reviewed pacifier, pumping, and bottles are not encouraged until breastfeeding is established and going well in the first 4 weeks, but if Mother would like to pump because of more formula supplementation LC would encourage more stimulation. Mother stated with her first child she hated pumping because it was painful. LC reviewed she may need smaller flanges such as 21 or 19 mm, and try not to turn the pump suction up to where it is painful. Parents stated understanding with all teaching.   Maternal Data Has patient been taught Hand Expression?: Yes Does the patient have breastfeeding experience prior to this delivery?: Yes How long did the patient breastfeed?: couple weeks, did not like pumping  Feeding Mother's Current Feeding Choice: Breast Milk and Formula Nipple Type: Slow - flow  LATCH Score Latch: Repeated attempts needed to sustain latch, nipple held in mouth throughout feeding, stimulation needed to elicit sucking reflex.  Audible Swallowing: A few with stimulation  Type of Nipple: Everted at rest and after stimulation  Comfort (Breast/Nipple): Soft / non-tender  Hold (Positioning): Assistance needed to correctly position infant at breast and maintain latch.  LATCH Score: 7   Lactation Tools  Discussed/Used    Interventions Interventions: Breast feeding basics reviewed;Assisted with latch;Skin to skin;Breast compression;Position options;Adjust position;Support pillows;Education  Discharge Discharge Education: Warning signs for feeding baby;Engorgement and breast care Pump: Advised to call insurance company (gave websites for electric pump through insurance) WIC Program: Yes  Consult Status Consult Status: PRN    Senora Lacson D Kirsty Monjaraz 05/21/2021, 3:03 PM

## 2022-04-22 IMAGING — US US OB COMP LESS 14 WK
1 series · 14 of 28 positions shown · non-contrast
Comparison: None.

CLINICAL DATA: No menses since [REDACTED]

EXAM:
OBSTETRIC <14 WK ULTRASOUND
TECHNIQUE: Transabdominal ultrasound was performed for evaluation of the
gestation as well as the maternal uterus and adnexal regions.

[Series 1: us ob comp less 14 wk · 0.15mm/px · 14 of 69 slices shown]
[im 3/69]
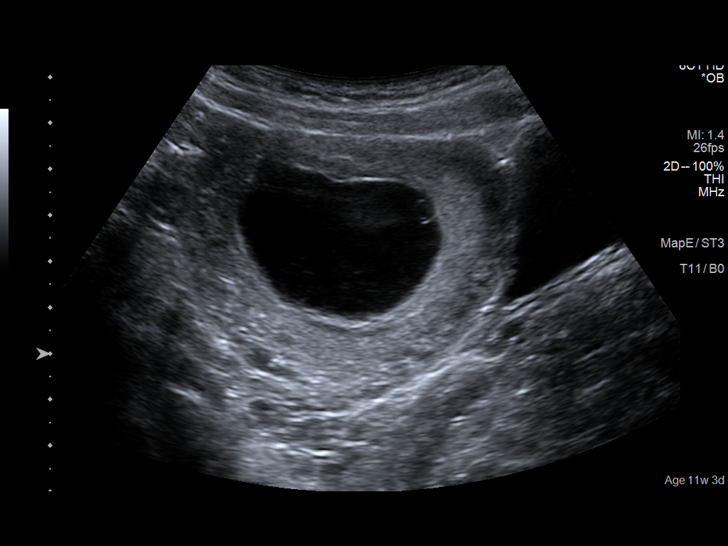
[im 8/69]
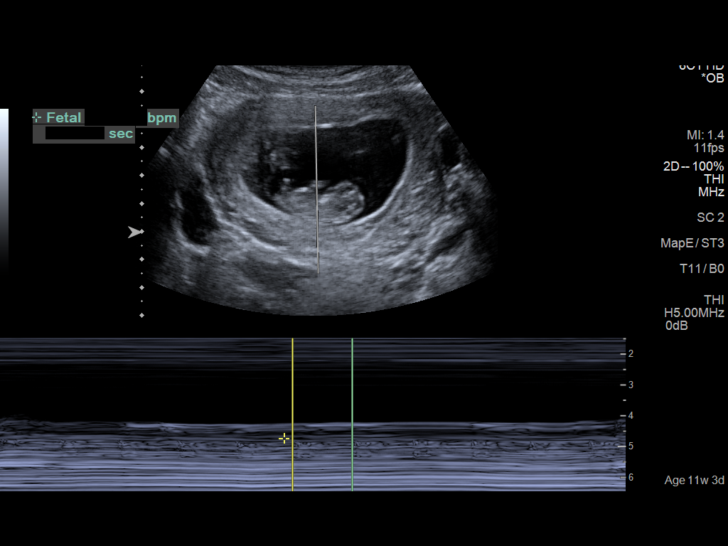
[im 13/69]
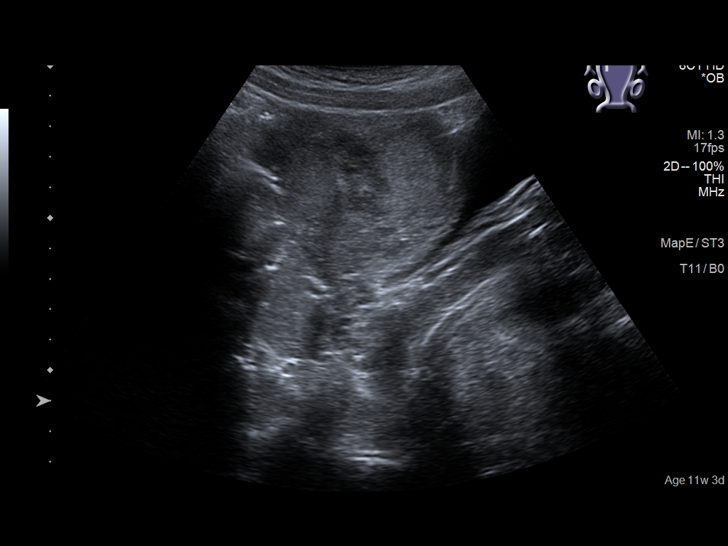
[im 18/69]
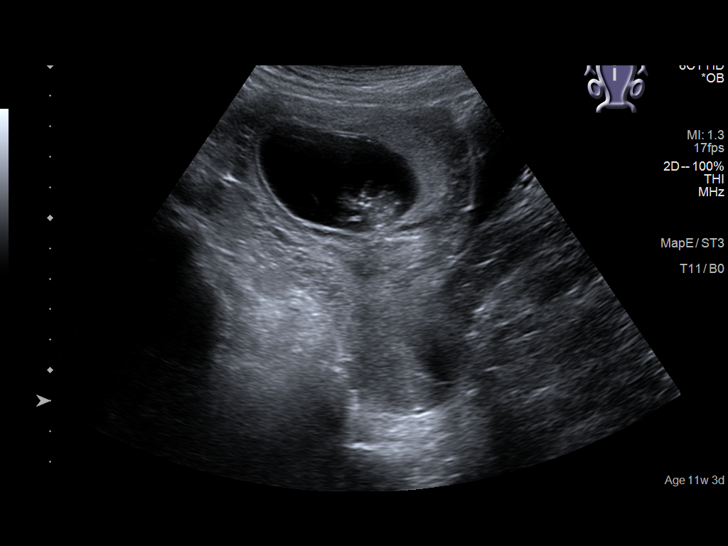
[im 23/69]
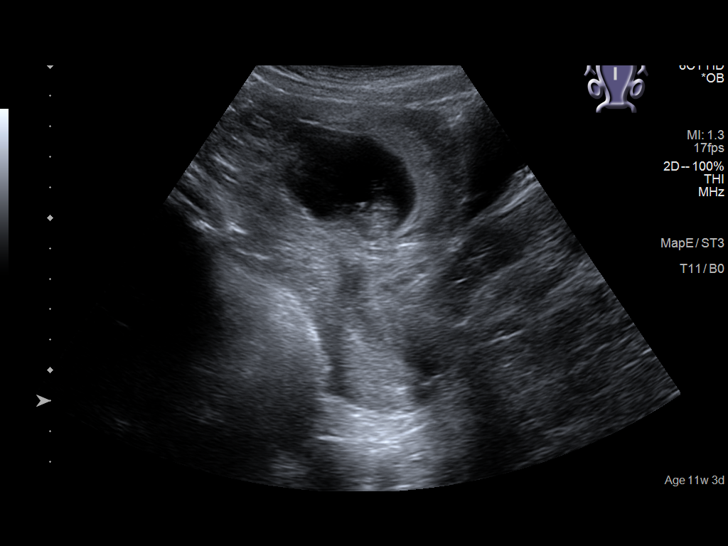
[im 28/69]
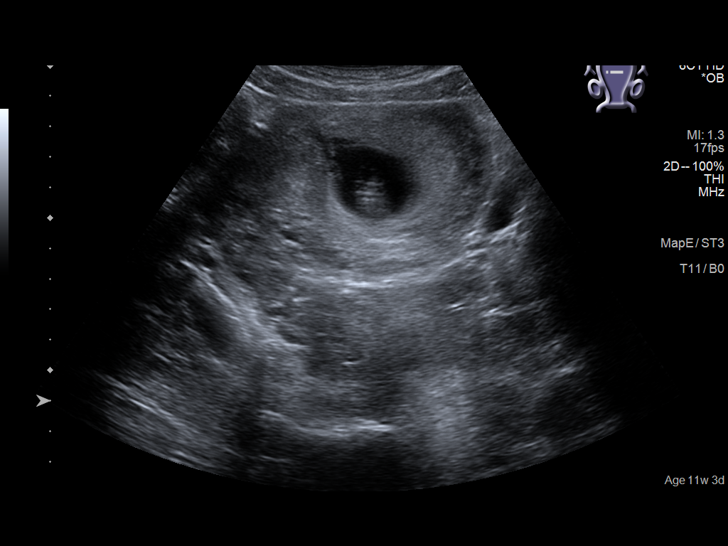
[im 33/69]
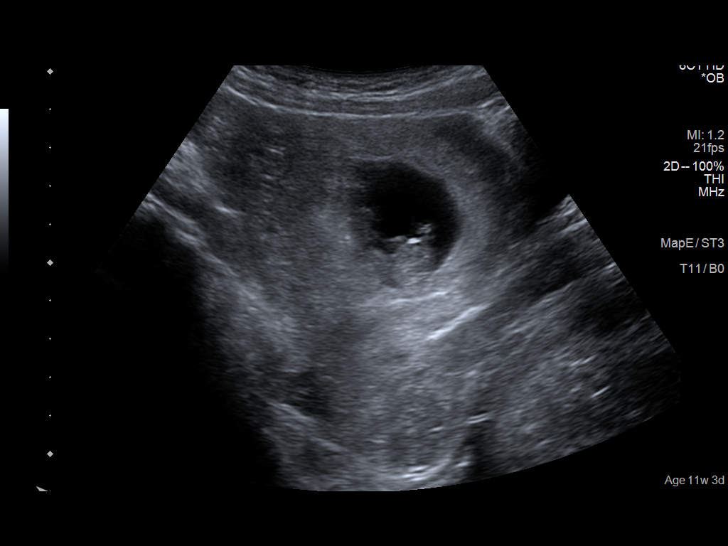
[im 38/69]
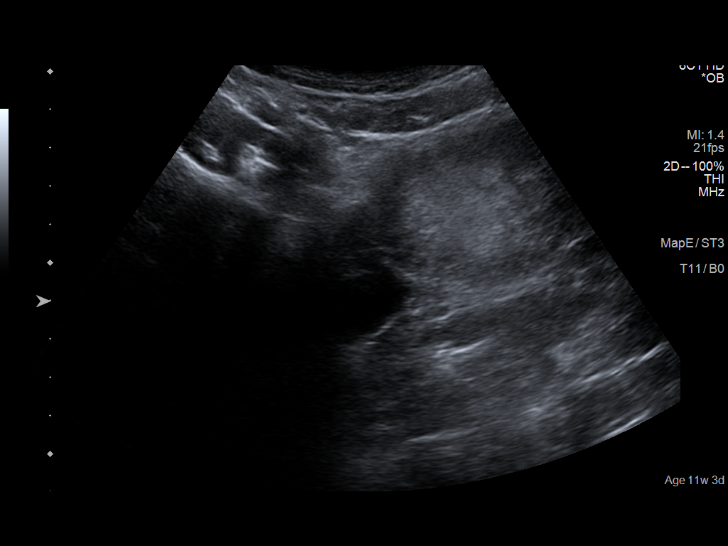
[im 43/69]
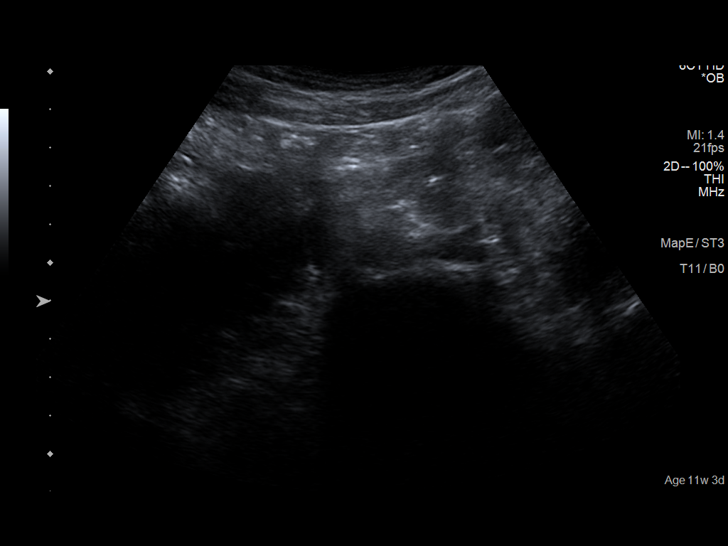
[im 48/69]
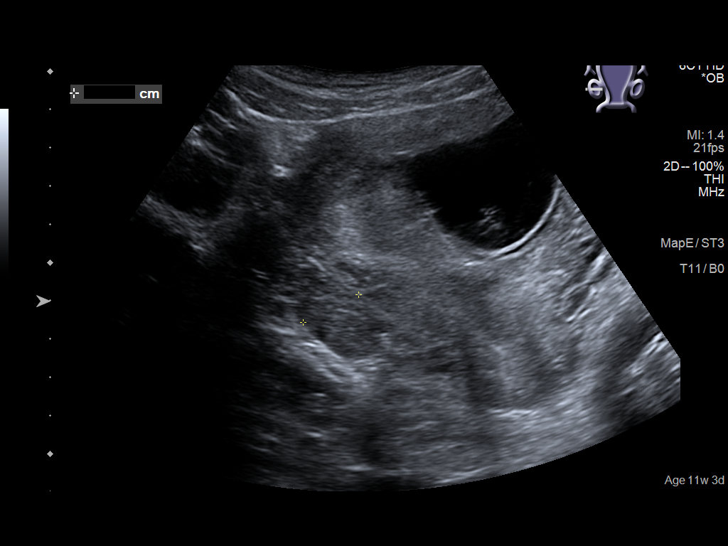
[im 53/69]
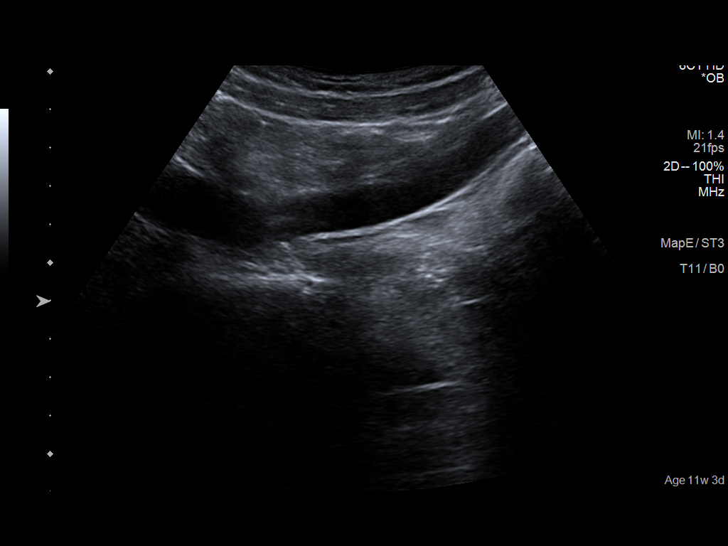
[im 58/69]
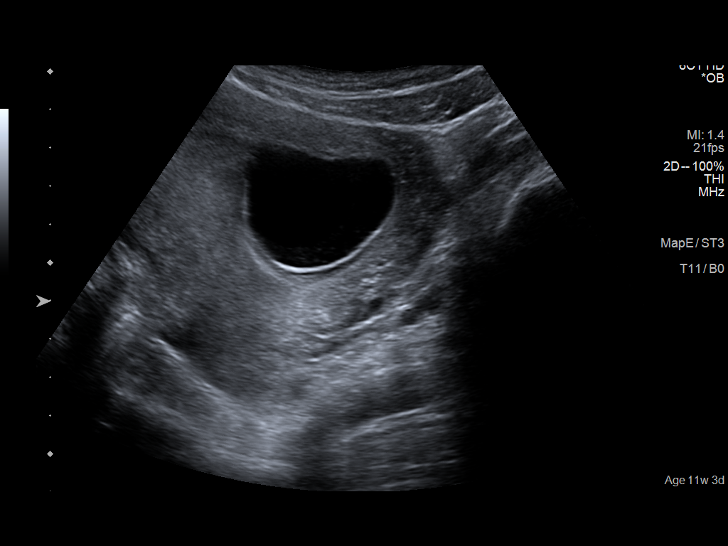
[im 63/69]
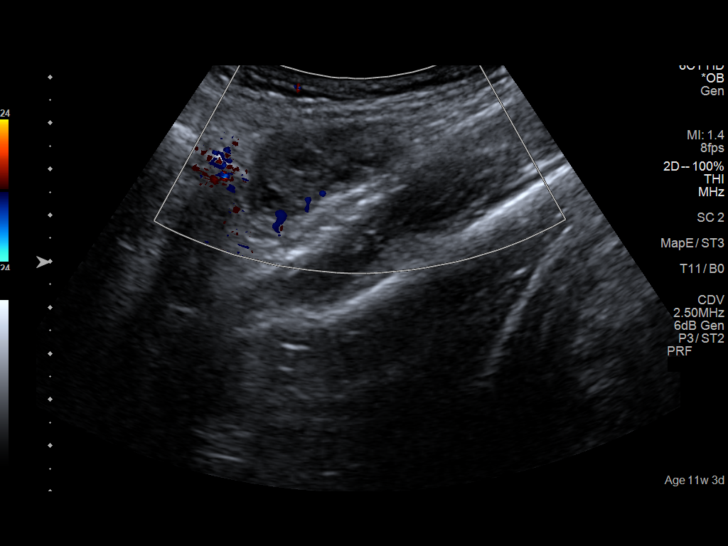
[im 69/69]
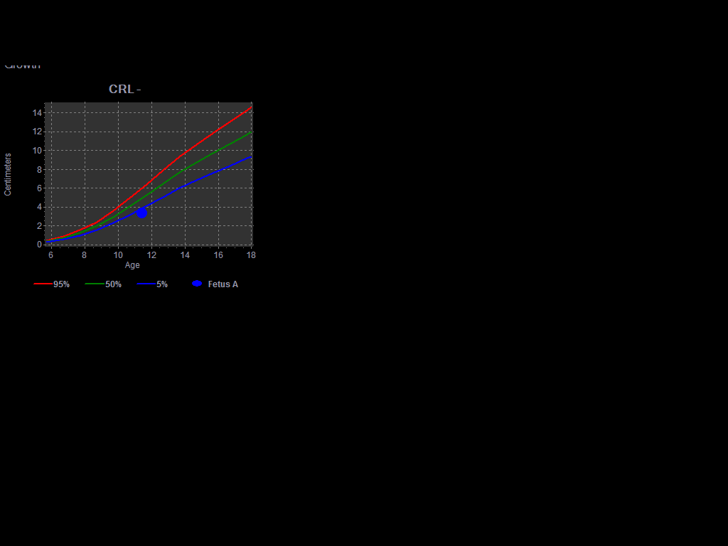

[14 of 28 positions shown; findings below may reference images not displayed]

FINDINGS: Intrauterine gestational sac: Single intrauterine gestational sac

Yolk sac:  Visualized.

Embryo:  Visualized.

Cardiac Activity: Visualized.

Heart Rate: 171 bpm

CRL: 34 mm   10 w 2 d                  US EDC: 05/05/2021

Subchorionic hemorrhage:  None visualized.

Maternal uterus/adnexae: Ovaries are within normal limits. Right
ovary measures 2.7 x 1.8 x 1.6 cm. The left ovary measures 3.6 x
x 2.5 cm.
IMPRESSION: Single viable intrauterine pregnancy as above. No specific
abnormality is seen.

## 2022-10-14 IMAGING — US US MFM OB COMP +14 WKS
1 series · 13 of 28 positions shown · non-contrast
Comparison: none

[Series 1: us mfm ob comp +14 wks · 55 acquisitions, 13 frames shown]
[im 3/55]
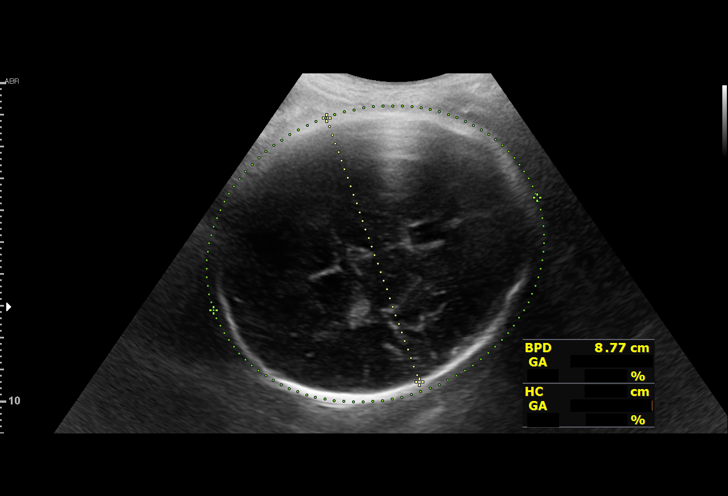
[im 7/55]
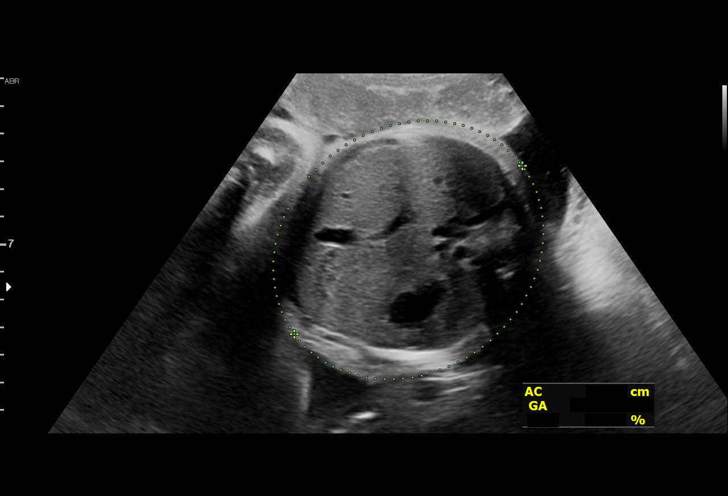
[im 11/55]
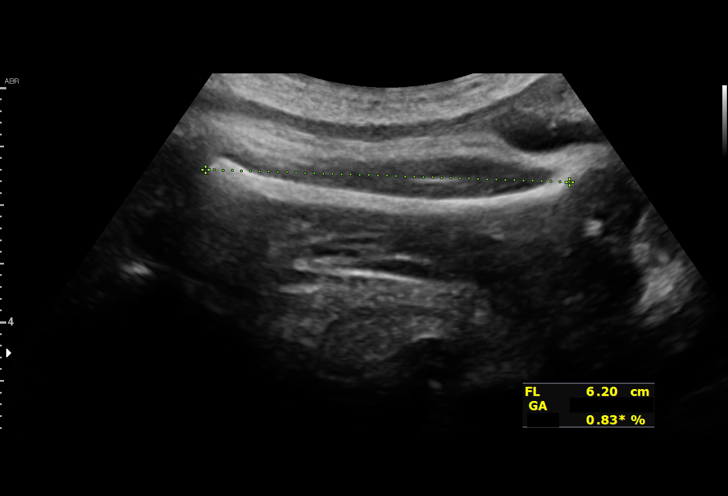
[im 15/55]
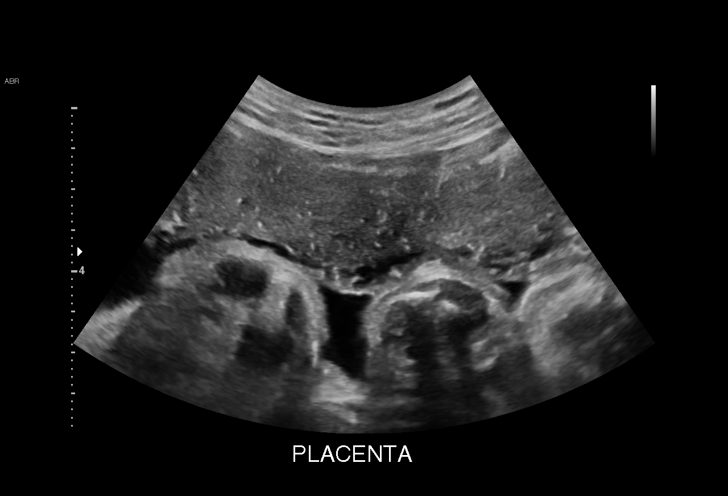
[im 19/55]
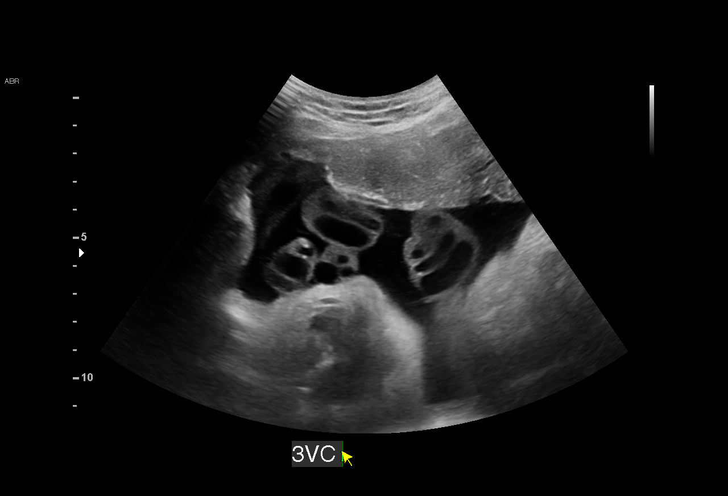
[im 23/55]
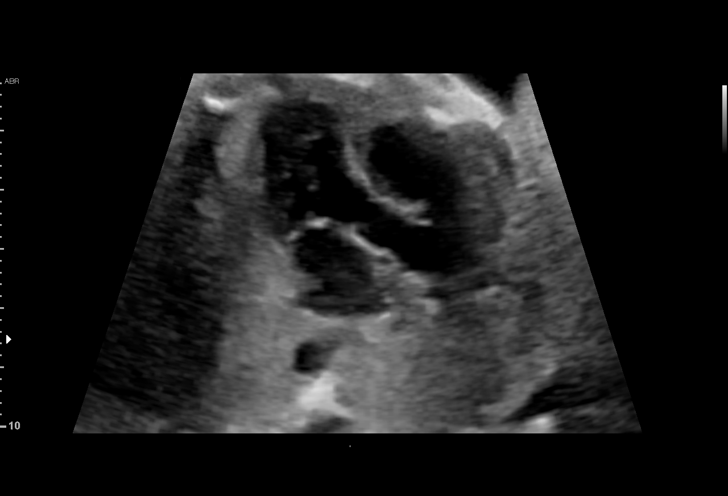
[im 29/55]
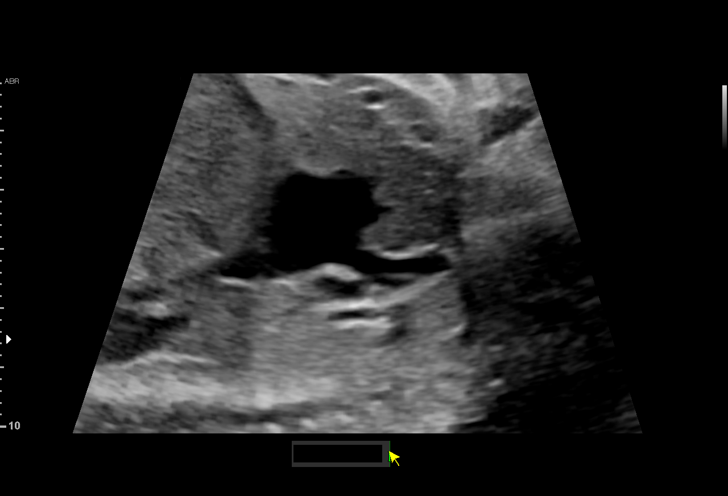
[im 33/55]
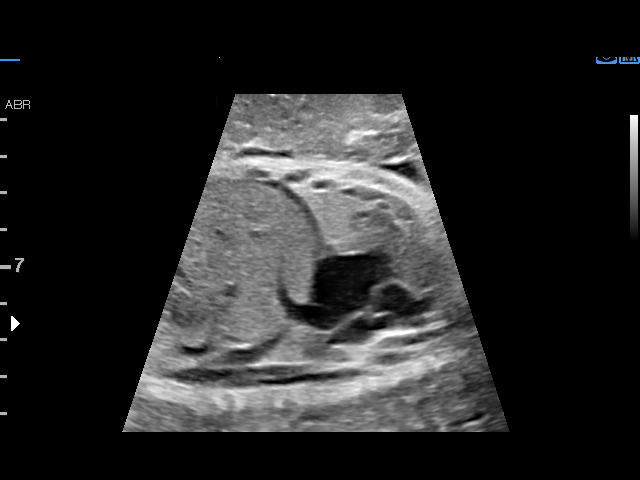
[im 37/55]
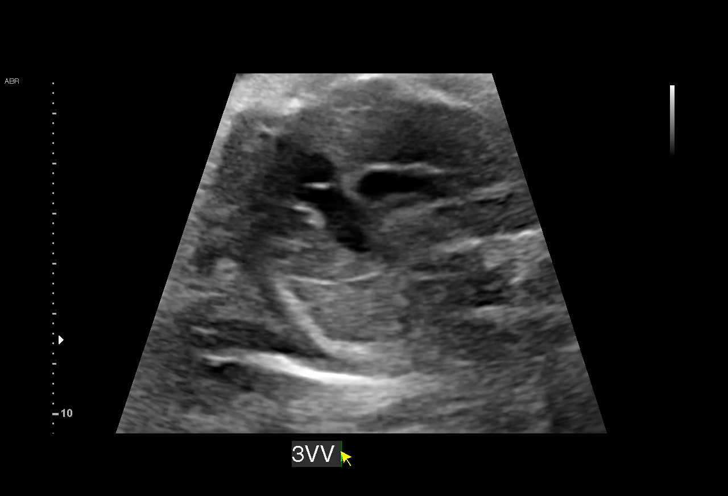
[im 41/55]
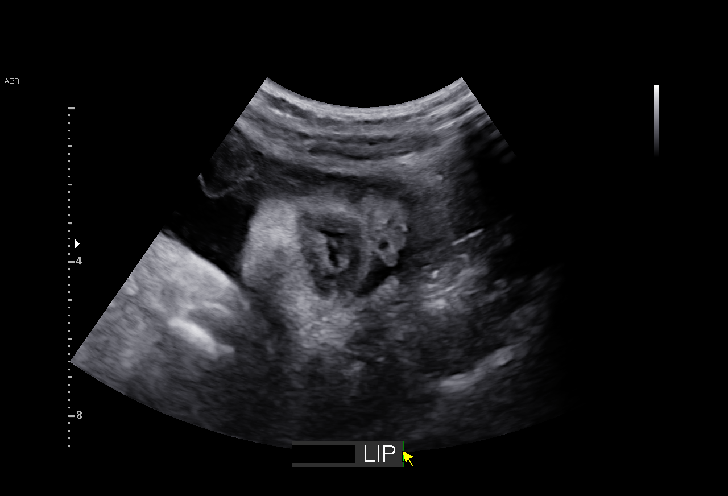
[im 45/55]
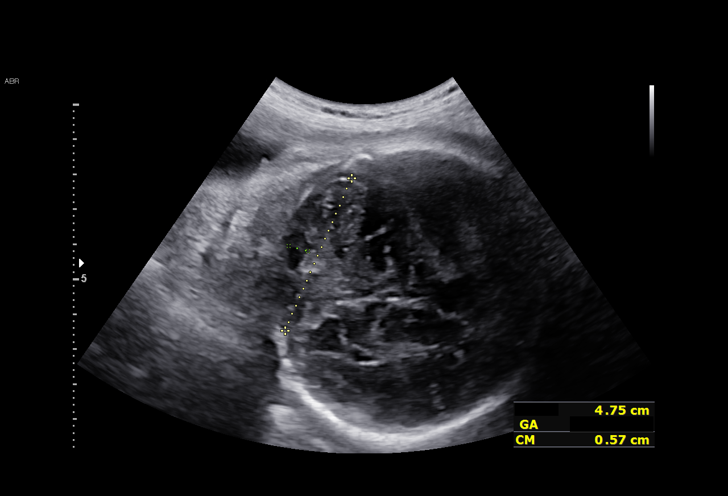
[im 49/55]
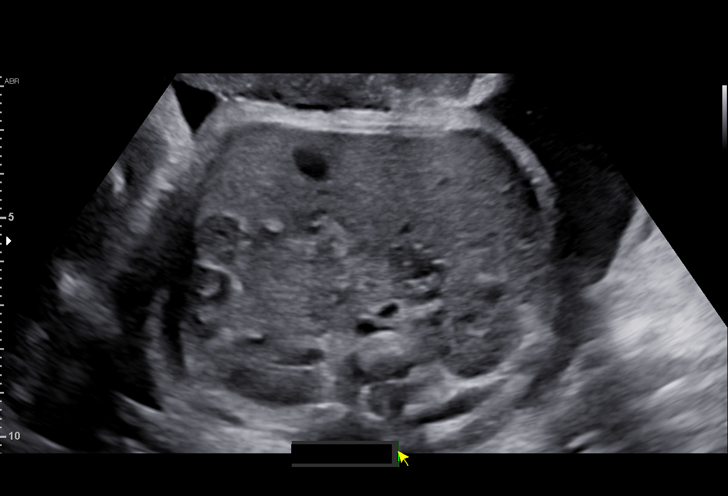
[im 53/55]
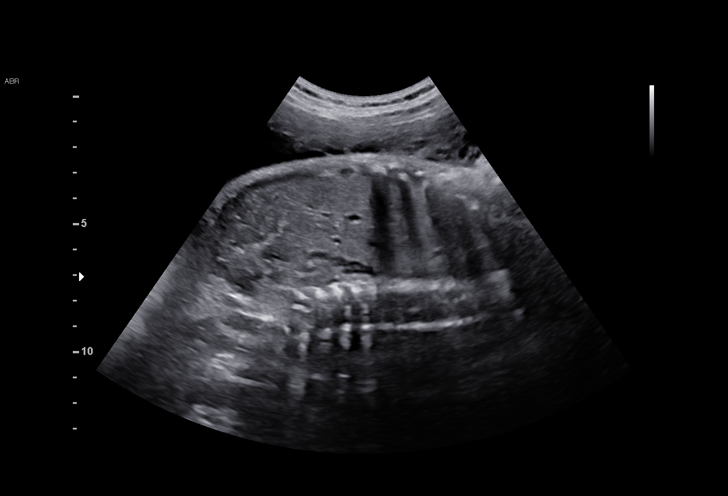

[13 of 28 positions shown; findings below may reference images not displayed]

[facility]

 1  US MFM OB COMP + 14 WK                76805.01    GEIDARAS DEBESYS

Indications

 Uterine size-date discrepancy, third trimester
 35 weeks gestation of pregnancy
 Encounter for antenatal screening for
 malformations
Fetal Evaluation

 Num Of Fetuses:         1
 Fetal Heart Rate(bpm):  143
 Cardiac Activity:       Observed
 Presentation:           Cephalic
 Placenta:               Anterior
 P. Cord Insertion:      Visualized, central

 Amniotic Fluid
 AFI FV:      Within normal limits

 AFI Sum(cm)     %Tile       Largest Pocket(cm)
 14.7            53

 RUQ(cm)       RLQ(cm)       LUQ(cm)        LLQ(cm)

Biophysical Evaluation

 Amniotic F.V:   Within normal limits       F. Tone:        Observed
 F. Movement:    Observed                   Score:          [DATE]
 F. Breathing:   Observed
Biometry

 BPD:      87.8  mm     G. Age:  35w 3d         60  %    CI:        79.06   %    70 - 86
                                                         FL/HC:      20.1   %    20.1 -
 HC:      312.2  mm     G. Age:  35w 0d         12  %    HC/AC:      1.02        0.93 -
 AC:      305.5  mm     G. Age:  34w 4d         34  %    FL/BPD:     71.6   %    71 - 87
 FL:       62.9  mm     G. Age:  32w 4d        1.7  %    FL/AC:      20.6   %    20 - 24
 HUM:      57.1  mm     G. Age:  33w 1d         24  %
 CER:      47.5  mm     G. Age:  35w 6d         49  %

 LV:        2.9  mm
 CM:        5.7  mm

 Est. FW:    5934  gm      5 lb 3 oz     18  %
OB History

 Gravidity:    2         Term:   1
 Living:       1
Gestational Age

 LMP:           36w 3d        Date:  07/29/20                 EDD:   05/05/21
 U/S Today:     34w 3d                                        EDD:   05/19/21
 Best:          35w 2d     Det. By:  Early Ultrasound         EDD:   05/13/21
                                     (01/03/21)
Anatomy

 Cranium:               Appears normal         Aortic Arch:            Appears normal
 Cavum:                 Appears normal         Ductal Arch:            Appears normal
 Ventricles:            Appears normal         Diaphragm:              Appears normal
 Choroid Plexus:        Appears normal         Stomach:                Appears normal, left
                                                                       sided
 Cerebellum:            Appears normal         Abdomen:                Appears normal
 Posterior Fossa:       Appears normal         Abdominal Wall:         Appears nml (cord
                                                                       insert, abd wall)
 Nuchal Fold:           Not applicable (>20    Cord Vessels:           Appears normal (3
                        wks GA)                                        vessel cord)
 Face:                  Appears normal         Kidneys:                Appear normal
                        (orbits and profile)
 Lips:                  Appears normal         Bladder:                Appears normal
 Heart:                 Appears normal         Spine:                  Not well visualized
                        (4CH, axis, and
                        situs)
 RVOT:                  Appears normal         Upper Extremities:      Not well visualized
 LVOT:                  Appears normal         Lower Extremities:      Not well visualized

 Other:  Fetus appears to be female.
Impression

 Single intrauterine pregnancy here for a detailed anatomy
 due size and date discrepancy
 Normal anatomy with measurements consistent with dates
 There is good fetal movement and amniotic fluid volume
 Suboptimal views of the fetal anatomy were obtained
 secondary to fetal position.
 Biophysical profile [DATE]
Recommendations

 Follow up growth as clinically indicated.
